# Patient Record
Sex: Male | Born: 1985 | Race: Black or African American | Hispanic: No | Marital: Single | Smoking: Never smoker
Health system: Southern US, Community
[De-identification: ages and names within clinical notes are randomized; demographics above are authoritative.]

---

## 2019-10-14 ENCOUNTER — Other Ambulatory Visit: Payer: Self-pay

## 2019-10-14 ENCOUNTER — Emergency Department (HOSPITAL_COMMUNITY)
Admission: EM | Admit: 2019-10-14 | Discharge: 2019-10-14 | Attending: Emergency Medicine | Admitting: Emergency Medicine

## 2019-10-14 ENCOUNTER — Emergency Department (HOSPITAL_COMMUNITY)

## 2019-10-14 ENCOUNTER — Encounter (HOSPITAL_COMMUNITY): Payer: Self-pay | Admitting: Emergency Medicine

## 2019-10-14 DIAGNOSIS — R509 Fever, unspecified: Secondary | ICD-10-CM | POA: Insufficient documentation

## 2019-10-14 DIAGNOSIS — I10 Essential (primary) hypertension: Secondary | ICD-10-CM | POA: Diagnosis not present

## 2019-10-14 DIAGNOSIS — R197 Diarrhea, unspecified: Secondary | ICD-10-CM | POA: Insufficient documentation

## 2019-10-14 DIAGNOSIS — Z79899 Other long term (current) drug therapy: Secondary | ICD-10-CM | POA: Insufficient documentation

## 2019-10-14 DIAGNOSIS — R519 Headache, unspecified: Secondary | ICD-10-CM | POA: Diagnosis present

## 2019-10-14 DIAGNOSIS — R05 Cough: Secondary | ICD-10-CM | POA: Insufficient documentation

## 2019-10-14 DIAGNOSIS — U071 COVID-19: Secondary | ICD-10-CM | POA: Insufficient documentation

## 2019-10-14 LAB — TROPONIN I (HIGH SENSITIVITY)
Troponin I (High Sensitivity): 6 ng/L (ref <18)
Troponin I (High Sensitivity): 5 ng/L (ref <18)

## 2019-10-14 LAB — LACTIC ACID, PLASMA: Lactic Acid, Venous: 1 mmol/L (ref 0.5–1.9)

## 2019-10-14 LAB — COMPREHENSIVE METABOLIC PANEL
ALT: 43 U/L (ref 0–44)
AST: 34 U/L (ref 15–41)
Albumin: 4.6 g/dL (ref 3.5–5.0)
Alkaline Phosphatase: 66 U/L (ref 38–126)
Anion gap: 12 (ref 5–15)
BUN: 10 mg/dL (ref 6–20)
CO2: 27 mmol/L (ref 22–32)
Calcium: 8.9 mg/dL (ref 8.9–10.3)
Chloride: 98 mmol/L (ref 98–111)
Creatinine, Ser: 1.13 mg/dL (ref 0.61–1.24)
GFR calc Af Amer: >60 mL/min (ref >60)
GFR calc non Af Amer: >60 mL/min (ref >60)
Glucose, Bld: 108 mg/dL — ABNORMAL HIGH (ref 70–99)
Potassium: 3.5 mmol/L (ref 3.5–5.1)
Sodium: 137 mmol/L (ref 135–145)
Total Bilirubin: 0.7 mg/dL (ref 0.3–1.2)
Total Protein: 8.3 g/dL — ABNORMAL HIGH (ref 6.5–8.1)

## 2019-10-14 LAB — CBC WITH DIFFERENTIAL/PLATELET
Abs Immature Granulocytes: 0.03 10*3/uL (ref 0.00–0.07)
Basophils Absolute: 0 10*3/uL (ref 0.0–0.1)
Basophils Relative: 0 %
Eosinophils Absolute: 0 10*3/uL (ref 0.0–0.5)
Eosinophils Relative: 0 %
HCT: 46.1 % (ref 39.0–52.0)
Hemoglobin: 14.2 g/dL (ref 13.0–17.0)
Immature Granulocytes: 0 %
Lymphocytes Relative: 7 %
Lymphs Abs: 0.6 10*3/uL — ABNORMAL LOW (ref 0.7–4.0)
MCH: 26 pg (ref 26.0–34.0)
MCHC: 30.8 g/dL (ref 30.0–36.0)
MCV: 84.4 fL (ref 80.0–100.0)
Monocytes Absolute: 0.9 10*3/uL (ref 0.1–1.0)
Monocytes Relative: 11 %
Neutro Abs: 6.7 10*3/uL (ref 1.7–7.7)
Neutrophils Relative %: 82 %
Platelets: 224 10*3/uL (ref 150–400)
RBC: 5.46 MIL/uL (ref 4.22–5.81)
RDW: 12.5 % (ref 11.5–15.5)
WBC: 8.3 10*3/uL (ref 4.0–10.5)
nRBC: 0 % (ref 0.0–0.2)

## 2019-10-14 NOTE — ED Triage Notes (Signed)
Patient brought in by [facility] for COVID and weakness. Patient tested positive for COVID yesterday and was brought in by [facility] for weakness.

## 2019-10-14 NOTE — Discharge Instructions (Addendum)
Drink plenty of fluids so you do not get dehydrated.  You can have acetaminophen 650 mg every 6 hours as needed for fever.  Your chest x-ray tonight looks good, there is no obvious pneumonia seen.  However you should start taking zinc 50 mg once a day, you should also take vitamin D and vitamin C daily.  Recheck if you get chest pain, struggle to breathe, or you feel like you are getting dehydrated.

## 2019-10-14 NOTE — ED Provider Notes (Signed)
[facility] EMERGENCY DEPARTMENT Provider Note   CSN: [id number] Arrival date & time: 10/14/19  0014     History Chief Complaint  Patient presents with  . COVID weakness    Lawrence Frederick is a 34 y.o. male.  HPI   Patient states about 2 days ago he started getting frontal headache, chills, and fever today up to 101 degrees.  He has had a cough and some nausea without sore throat, vomiting, or diarrhea.  He denies chest pain or shortness of breath.  He states he had a Covid [facility] tonight [facility].  He states everybody in his cellblock has Covid.  He states his only medical problem is hypertension.  PCP Patient, No Pcp Per   History reviewed. No pertinent past medical history.  There are no problems to display for this patient.   History reviewed. No pertinent surgical history.     History reviewed. No pertinent family history.  Social History   Tobacco Use  . Smoking status: Never Smoker  . Smokeless tobacco: Never Used  Substance Use Topics  . Alcohol use: Not Currently  . Drug use: Never    Home Medications Prior to Admission medications   Medication Sig Start Date End Date Taking? Authorizing Provider  amLODipine (NORVASC) 10 MG tablet Take 10 mg by mouth daily.   Yes [provider]  cloNIDine (CATAPRES) 0.2 MG tablet Take 0.2 mg by mouth 2 (two) times daily.   Yes [provider]  lisinopril (ZESTRIL) 20 MG tablet Take 20 mg by mouth daily.   Yes [provider]  potassium chloride (KLOR-CON) 10 MEQ tablet Take 10 mEq by mouth daily.   Yes [provider]  triamterene-hydrochlorothiazide (MAXZIDE-25) 37.5-25 MG tablet Take 1 tablet by mouth daily.   Yes [provider]    Allergies    Penicillins  Review of Systems   Review of Systems  All other systems reviewed and are negative.   Physical Exam Updated Vital Signs BP (!) 141/104   Pulse 82   Temp 98.8 F (37.1 C) (Oral)   Resp 18    Ht 6' (1.829 m)   Wt 72.6 kg   SpO2 100%   BMI 21.70 kg/m   Vital signs normal except for diastolic hypertension   Physical Exam Vitals and nursing note reviewed.  Constitutional:      Appearance: Normal appearance. He is normal weight.  HENT:     Head: Normocephalic and atraumatic.     Right Ear: External ear normal.     Left Ear: External ear normal.     Nose: Nose normal.     Mouth/Throat:     Mouth: Mucous membranes are moist.  Eyes:     Extraocular Movements: Extraocular movements intact.     Conjunctiva/sclera: Conjunctivae normal.     Pupils: Pupils are equal, round, and reactive to light.  Cardiovascular:     Rate and Rhythm: Normal rate and regular rhythm.     Pulses: Normal pulses.  Pulmonary:     Effort: Pulmonary effort is normal. No respiratory distress.     Breath sounds: Normal breath sounds.  Abdominal:     General: Abdomen is flat. Bowel sounds are normal.     Palpations: Abdomen is soft.  Musculoskeletal:     Cervical back: Normal range of motion.  Skin:    General: Skin is warm and dry.     Findings: No rash.  Neurological:     General:  No focal deficit present.     Mental Status: He is alert and oriented to person, place, and time.     Cranial Nerves: No cranial nerve deficit.  Psychiatric:        Mood and Affect: Mood normal.        Behavior: Behavior normal.        Thought Content: Thought content normal.     ED Results / Procedures / Treatments   Labs (all labs ordered are listed, but only abnormal results are displayed) Results for orders placed or performed during the hospital encounter of 10/14/19  CBC with Differential  Result Value Ref Range   WBC 8.3 4.0 - 10.5 K/uL   RBC 5.46 4.22 - 5.81 MIL/uL   Hemoglobin 14.2 13.0 - 17.0 g/dL   HCT 45.8 59.2 - 92.4 %   MCV 84.4 80.0 - 100.0 fL   MCH 26.0 26.0 - 34.0 pg   MCHC 30.8 30.0 - 36.0 g/dL   RDW 46.2 86.3 - 81.7 %   Platelets 224 150 - 400 K/uL   nRBC 0.0 0.0 - 0.2 %    Neutrophils Relative % 82 %   Neutro Abs 6.7 1.7 - 7.7 K/uL   Lymphocytes Relative 7 %   Lymphs Abs 0.6 (L) 0.7 - 4.0 K/uL   Monocytes Relative 11 %   Monocytes Absolute 0.9 0.1 - 1.0 K/uL   Eosinophils Relative 0 %   Eosinophils Absolute 0.0 0.0 - 0.5 K/uL   Basophils Relative 0 %   Basophils Absolute 0.0 0.0 - 0.1 K/uL   Immature Granulocytes 0 %   Abs Immature Granulocytes 0.03 0.00 - 0.07 K/uL  Comprehensive metabolic panel  Result Value Ref Range   Sodium 137 135 - 145 mmol/L   Potassium 3.5 3.5 - 5.1 mmol/L   Chloride 98 98 - 111 mmol/L   CO2 27 22 - 32 mmol/L   Glucose, Bld 108 (H) 70 - 99 mg/dL   BUN 10 6 - 20 mg/dL   Creatinine, Ser 7.11 0.61 - 1.24 mg/dL   Calcium 8.9 8.9 - 65.7 mg/dL   Total Protein 8.3 (H) 6.5 - 8.1 g/dL   Albumin 4.6 3.5 - 5.0 g/dL   AST 34 15 - 41 U/L   ALT 43 0 - 44 U/L   Alkaline Phosphatase 66 38 - 126 U/L   Total Bilirubin 0.7 0.3 - 1.2 mg/dL   GFR calc non Af Amer >60 >60 mL/min   GFR calc Af Amer >60 >60 mL/min   Anion gap 12 5 - 15  Lactic acid, plasma  Result Value Ref Range   Lactic Acid, Venous 1.0 0.5 - 1.9 mmol/L  Troponin I (High Sensitivity)  Result Value Ref Range   Troponin I (High Sensitivity) 6 <18 ng/L  Troponin I (High Sensitivity)  Result Value Ref Range   Troponin I (High Sensitivity) 5 <18 ng/L   Laboratory interpretation all normal except nonfasting hyperglycemia    EKG EKG Interpretation  Date/Time:  Tuesday October 14 2019 01:05:04 EDT Ventricular Rate:  79 PR Interval:    QRS Duration: 93 QT Interval:  374 QTC Calculation: 429 R Axis:   60 Text Interpretation: Sinus rhythm Probable anteroseptal infarct, old Repol abnrm suggests ischemia, inferior leads Minimal ST elevation, lateral leads No old tracing to compare Confirmed by Devoria Albe ([id number]) on 10/14/2019 1:48:25 AM   Radiology DG Chest Port 1 View  Result Date: 10/14/2019 CLINICAL DATA:  Weakness.  Tested positive for COVID-19 yesterday.  EXAM:  PORTABLE CHEST 1 VIEW COMPARISON:  None. FINDINGS: The heart size and mediastinal contours are within normal limits. Both lungs are clear. The visualized skeletal structures are unremarkable. IMPRESSION: No active disease. Electronically Signed   By: Aram Candela M.D.   On: 10/14/2019 01:30    Procedures Procedures (including critical care time)  Medications Ordered in ED Medications - No data to display  ED Course  I have reviewed the triage vital signs and the nursing notes.  Pertinent labs & imaging results that were available during my care of the patient were reviewed by me and considered in my medical decision making (see chart for details).    MDM Rules/Calculators/A&P                      Patient's pulse ox is 100% when I him in the room with him.  His laboratory testing is normal.  His chest x-ray does not show infiltrate.   Lawrence Frederick was evaluated in Emergency Department on 10/14/2019 for the symptoms described in the history of present illness. He was evaluated in the context of the global COVID-19 pandemic, which necessitated consideration that the patient might be at risk for infection with the SARS-CoV-2 virus that causes COVID-19. Institutional protocols and algorithms that pertain to the evaluation of patients at risk for COVID-19 are in a state of rapid change based on information released by regulatory bodies including the CDC and federal and state organizations. These policies and algorithms were followed during the patient's care in the ED.   Final Clinical Impression(s) / ED Diagnoses Final diagnoses:  COVID-19    Rx / DC Orders ED Discharge Orders    None    OTC zinc, VitD, VitE  Plan discharge  Devoria Albe, MD, Concha Pyo, MD 10/14/19 [phone]

## 2021-04-13 IMAGING — DX DG CHEST 1V PORT
1 series · 1 of 1 positions shown · non-contrast
Comparison: None.

CLINICAL DATA: Weakness.  Tested positive for HR66G-Y0 yesterday.

EXAM:
PORTABLE CHEST 1 VIEW

[chest ap]
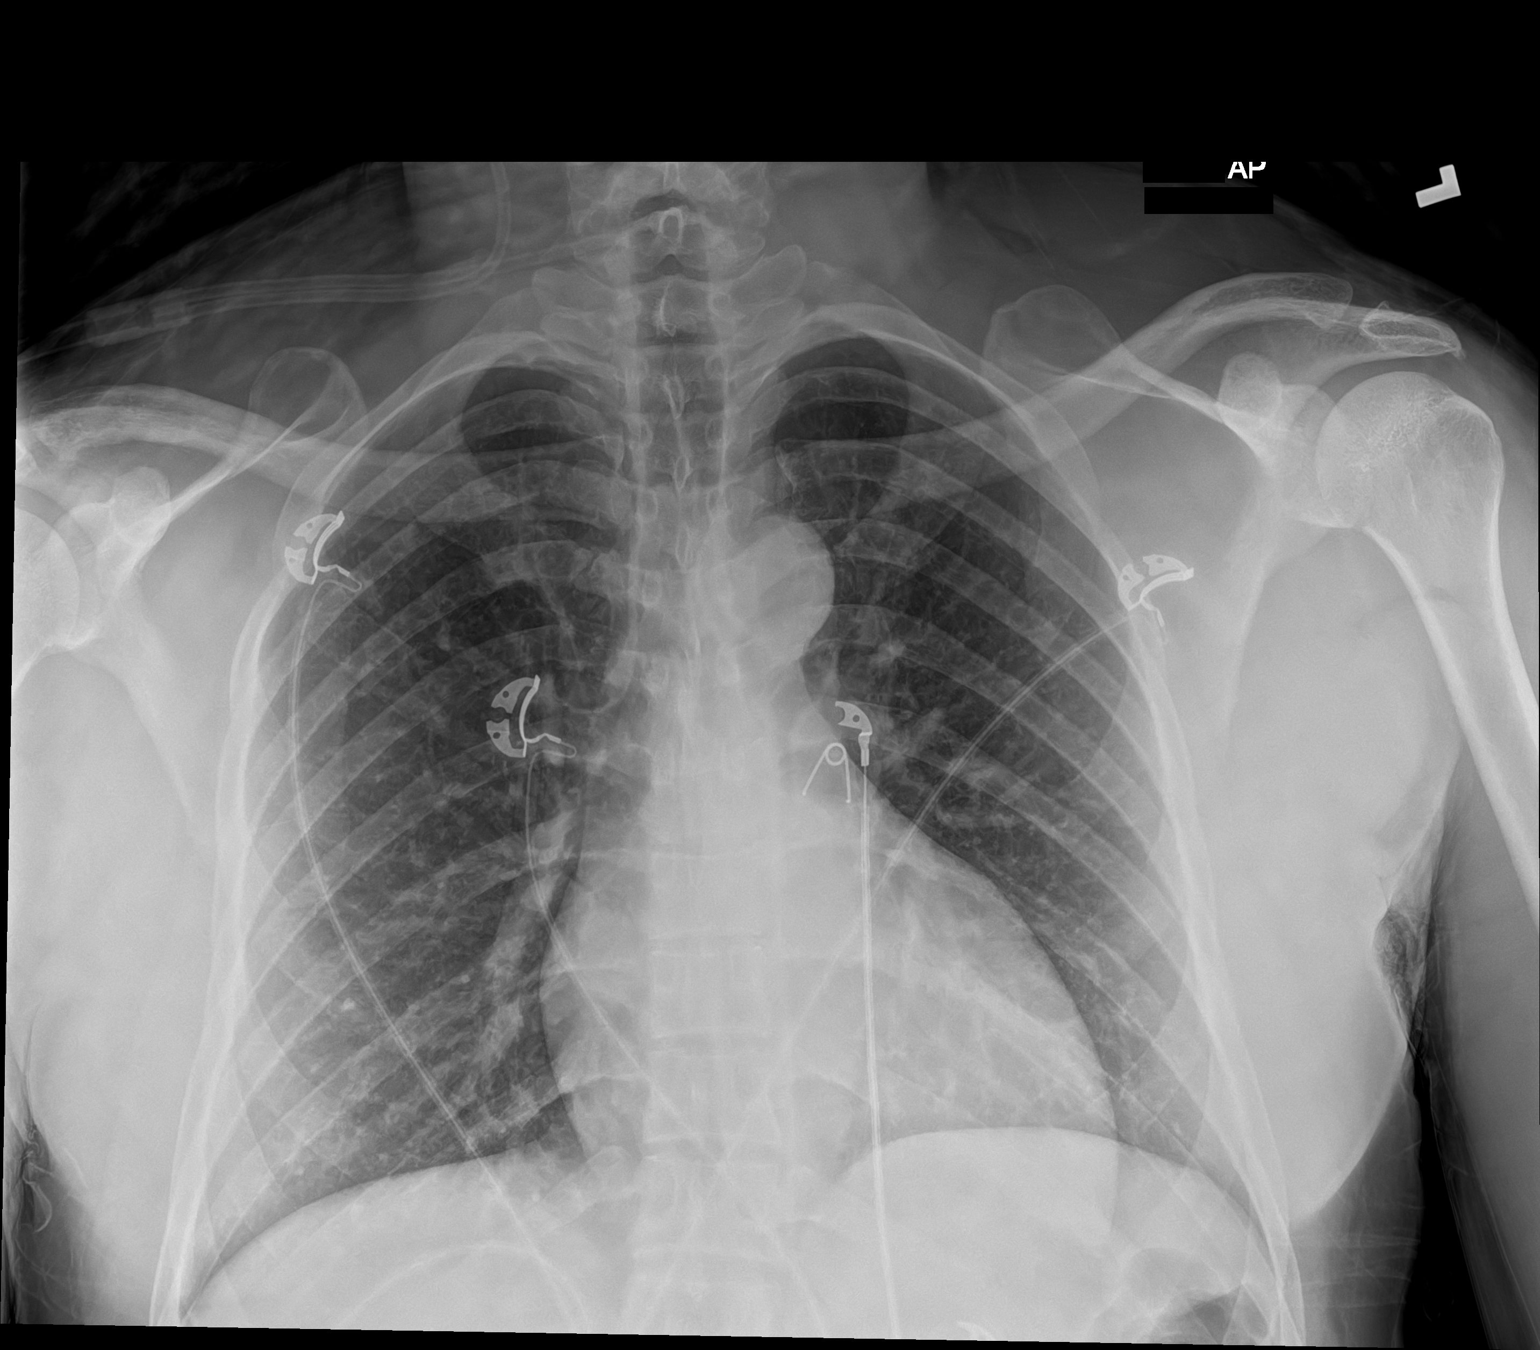

[1 of 1 positions shown; findings below may reference images not displayed]

FINDINGS: The heart size and mediastinal contours are within normal limits.
Both lungs are clear. The visualized skeletal structures are
unremarkable.
IMPRESSION: No active disease.
